# Patient Record
Sex: Female | Born: 1995 | Race: White | Hispanic: No | Marital: Single | Smoking: Never smoker
Health system: Southern US, Community
[De-identification: ages and names within clinical notes are randomized; demographics above are authoritative.]

## PROBLEM LIST (undated history)

## (undated) DIAGNOSIS — F909 Attention-deficit hyperactivity disorder, unspecified type: Secondary | ICD-10-CM

---
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
## 2017-07-06 ENCOUNTER — Other Ambulatory Visit: Payer: Self-pay

## 2017-07-06 ENCOUNTER — Emergency Department (HOSPITAL_COMMUNITY)
Admission: EM | Admit: 2017-07-06 | Discharge: 2017-07-07 | Disposition: A | Discharge status: Home / Self Care | Payer: BC Managed Care – PPO | Attending: Emergency Medicine | Admitting: Emergency Medicine

## 2017-07-06 ENCOUNTER — Emergency Department (HOSPITAL_COMMUNITY): Payer: BC Managed Care – PPO

## 2017-07-06 ENCOUNTER — Ambulatory Visit (HOSPITAL_COMMUNITY)
Admission: EM | Admit: 2017-07-06 | Discharge: 2017-07-06 | Disposition: A | Discharge status: Home / Self Care | Payer: BC Managed Care – PPO | Source: Home / Self Care

## 2017-07-06 ENCOUNTER — Encounter (HOSPITAL_COMMUNITY): Payer: Self-pay | Admitting: Emergency Medicine

## 2017-07-06 DIAGNOSIS — H1132 Conjunctival hemorrhage, left eye: Secondary | ICD-10-CM | POA: Diagnosis not present

## 2017-07-06 DIAGNOSIS — S0282XA Fracture of other specified skull and facial bones, left side, initial encounter for closed fracture: Secondary | ICD-10-CM | POA: Insufficient documentation

## 2017-07-06 DIAGNOSIS — W2103XA Struck by baseball, initial encounter: Secondary | ICD-10-CM | POA: Diagnosis not present

## 2017-07-06 DIAGNOSIS — Y999 Unspecified external cause status: Secondary | ICD-10-CM | POA: Diagnosis not present

## 2017-07-06 DIAGNOSIS — Y939 Activity, unspecified: Secondary | ICD-10-CM | POA: Diagnosis not present

## 2017-07-06 DIAGNOSIS — S0592XA Unspecified injury of left eye and orbit, initial encounter: Secondary | ICD-10-CM | POA: Diagnosis present

## 2017-07-06 DIAGNOSIS — Y929 Unspecified place or not applicable: Secondary | ICD-10-CM | POA: Insufficient documentation

## 2017-07-06 DIAGNOSIS — S0512XA Contusion of eyeball and orbital tissues, left eye, initial encounter: Secondary | ICD-10-CM | POA: Diagnosis not present

## 2017-07-06 DIAGNOSIS — Z79899 Other long term (current) drug therapy: Secondary | ICD-10-CM | POA: Insufficient documentation

## 2017-07-06 DIAGNOSIS — S0285XA Fracture of orbit, unspecified, initial encounter for closed fracture: Secondary | ICD-10-CM

## 2017-07-06 HISTORY — DX: Attention-deficit hyperactivity disorder, unspecified type: F90.9

## 2017-07-06 NOTE — ED Provider Notes (Signed)
[facility]    CSN: [id number] Arrival date & time: 07/06/17  1840     History   Chief Complaint Chief Complaint  Patient presents with  . Eye Injury    HPI Olivia Schmitt is a 22 y.o. female.   The history is provided by the patient. No language interpreter was used.  Eye Injury  This is a new problem. The current episode started more than 2 days ago. The problem occurs constantly. The problem has been gradually worsening. Nothing aggravates the symptoms. Nothing relieves the symptoms. She has tried nothing for the symptoms. The treatment provided no relief.  Pt was hit in the left eye 3 days ago.  Pt reports she was hit with a baseball.  Pt complains of pain, redness and double vision.    Past Medical History:  Diagnosis Date  . ADHD     There are no active problems to display for this patient.   History reviewed. No pertinent surgical history.  OB History   None      Home Medications    Prior to Admission medications   Not on File    Family History No family history on file.  Social History Social History   Tobacco Use  . Smoking status: Never Smoker  . Smokeless tobacco: Never Used  Substance Use Topics  . Alcohol use: Not on file  . Drug use: Not on file     Allergies   Patient has no known allergies.   Review of Systems Review of Systems  All other systems reviewed and are negative.    Physical Exam Triage Vital Signs ED Triage Vitals [07/06/17 1852]  Enc Vitals Group     BP 137/85     Pulse Rate 60     Resp 18     Temp 98.7 F (37.1 C)     Temp src      SpO2 100 %     Weight      Height      Head Circumference      Peak Flow      Pain Score      Pain Loc      Pain Edu?      Excl. in GC?    No data found.  Updated Vital Signs BP 137/85   Pulse 60   Temp 98.7 F (37.1 C)   Resp 18   SpO2 100%   Visual Acuity Right Eye Distance: 20/20 Left Eye Distance: 20/100 Bilateral Distance: 20/50  Right Eye  Near:   Left Eye Near:    Bilateral Near:     Physical Exam  Constitutional: She appears well-developed and well-nourished.  HENT:  Head: Normocephalic.  Right Ear: External ear normal.  Left Ear: External ear normal.  Eyes: Pupils are equal, round, and reactive to light.  Subconjunctival hemorrhage left,  No hyphema   Cardiovascular: Normal rate.  Pulmonary/Chest: Effort normal.  Neurological: She is alert.  Skin: Skin is warm.  Psychiatric: She has a normal mood and affect.  Nursing note and vitals reviewed.    UC Treatments / Results  Labs (all labs ordered are listed, but only abnormal results are displayed) Labs Reviewed - No data to display  EKG None  Radiology No results found.  Procedures Procedures (including critical care time)  Medications Ordered in UC Medications - No data to display  Initial Impression / Assessment and Plan / UC Course  I have reviewed the triage vital signs and the  nursing notes.  Pertinent labs & imaging results that were available during my care of the patient were reviewed by me and considered in my medical decision making (see chart for details).     Pt to Ed for possible orbital rim fracture  Ct needs ct scan Final Clinical Impressions(s) / UC Diagnoses   Final diagnoses:  Contusion of left orbital tissues, initial encounter     Discharge Instructions     Go to the Emergency department for ct scan   ED Prescriptions    None     Controlled Substance Prescriptions [place] Controlled Substance Registry consulted? Not Applicable   Elson AreasSofia, Tricha Ruggirello K, [place]PA-C 07/06/17 1941

## 2017-07-06 NOTE — Discharge Instructions (Addendum)
Go to the Emergency department for ct scan

## 2017-07-06 NOTE — ED Triage Notes (Signed)
Pt sent from urgent care for orbital CT d/t injury from baseball hitting her left eye 3 days ago. Swelling has subsided. Headache and double vision continue.  Bruising noted.

## 2017-07-06 NOTE — ED Provider Notes (Signed)
[facility] EMERGENCY DEPARTMENT Provider Note   CSN: [id number] Arrival date & time: 07/06/17  1944     History   Chief Complaint Chief Complaint  Patient presents with  . Eye Injury    HPI Olivia Schmitt is a 22 y.o. female.  HPI Patient presents to the emergency department with a left eye injury that occurred 3 days ago.  The patient states she was hit in the left eye with a baseball.  The patient states that the left eye swelling is decreased since that time but is still swollen.  She states that she does have some double vision and blurry vision.  The patient states that she does have some pain with movements of her eye.  Patient denies headache, weakness, dizziness, neck pain, visual loss or syncope Past Medical History:  Diagnosis Date  . ADHD     There are no active problems to display for this patient.   History reviewed. No pertinent surgical history.   OB History   None      Home Medications    Prior to Admission medications   Medication Sig Start Date End Date Taking? Authorizing Provider  methylphenidate (RITALIN LA) 10 MG 24 hr capsule Take 30 mg by mouth daily as needed (days of class).   Yes [provider]  Norgestimate-Ethinyl Estradiol Triphasic (TRI-PREVIFEM) 0.18/0.215/0.25 MG-35 MCG tablet Take 1 tablet by mouth daily.   Yes [provider]    Family History No family history on file.  Social History Social History   Tobacco Use  . Smoking status: Never Smoker  . Smokeless tobacco: Never Used  Substance Use Topics  . Alcohol use: Not on file  . Drug use: Not on file     Allergies   Patient has no known allergies.   Review of Systems Review of Systems All other systems negative except as documented in the HPI. All pertinent positives and negatives as reviewed in the HPI. Physical Exam Updated Vital Signs BP 119/74 (BP Location: Right Arm)   Pulse (!) 55   Temp 98.7 F (37.1 C) (Oral)   Resp  18   LMP 07/05/2017 (Exact Date)   SpO2 100%   Physical Exam  Constitutional: She is oriented to person, place, and time. She appears well-developed and well-nourished. No distress.  HENT:  Head: Normocephalic and atraumatic.  Eyes: Pupils are equal, round, and reactive to light. Left eye exhibits no discharge. Left conjunctiva has a hemorrhage.    Pulmonary/Chest: Effort normal.  Neurological: She is alert and oriented to person, place, and time.  Skin: Skin is warm and dry.  Psychiatric: She has a normal mood and affect.  Nursing note and vitals reviewed.    ED Treatments / Results  Labs (all labs ordered are listed, but only abnormal results are displayed) Labs Reviewed - No data to display  EKG None  Radiology Ct Orbits Wo Contrast  Result Date: 07/06/2017 CLINICAL DATA:  Struck in LEFT eye with a baseball on Wednesday, eye is swollen and bruised, double vision, headache EXAM: CT ORBITS WITHOUT CONTRAST TECHNIQUE: Multidetector CT images were obtained using the standard protocol without intravenous contrast. Sagittal and coronal MPR images reconstructed from axial data set. Right side of face marked with BB. COMPARISON:  None FINDINGS: Orbits: No intraorbital soft tissue swelling or abnormal fluid collection. Symmetric optic globes. Comminuted fracture of the inferior wall of the LEFT orbit with extension of fat and the inferior rectus muscle through the fracture into  the superior LEFT maxillary sinus. Remaining orbital walls appear intact. No additional facial bone fractures identified. TMJ alignment normal. Visualized sinuses: Small amount of fluid dependently in the LEFT maxillary sinus. Minimal mucosal thickening in a few LEFT ethmoid air cells. Remaining paranasal sinuses, mastoid air cells, and middle ear cavities clear. Soft tissues: Remaining facial soft tissues unremarkable. Limited intracranial: Unremarkable IMPRESSION: Displaced comminuted fracture of the inferior wall  LEFT orbit with entrapment of the LEFT inferior rectus muscle and extension of fat into the superior aspect of the LEFT maxillary sinus. Electronically Signed   By: Ulyses SouthwardMark  Boles M.D.   On: 07/06/2017 20:36    Procedures Procedures (including critical care time)  Medications Ordered in ED Medications - No data to display   Initial Impression / Assessment and Plan / ED Course  I have reviewed the triage vital signs and the nursing notes.  Pertinent labs & imaging results that were available during my care of the patient were reviewed by me and considered in my medical decision making (see chart for details).    I spoke with the maxillofacial trauma doctor tonight who is Dr. Annalee GentaShoemaker who reviewed the CT scan and felt that the patient needs to follow-up with him next Wednesday.  Also spoke with Dr. Sherryll BurgerShah the ophthalmologist who will see the patient first thing Monday morning.  There is no signs of trauma to the globe and there is no hyphema and the patient's pupils are round and reactive.  There is no other signs of injury and the red reflex is intact.  I advised the patient and her father of the plan and all questions were answered.  Patient agrees to the plan.  I did give the patient strict return precautions to the emergency department and patient voiced an understanding of these.  Final Clinical Impressions(s) / ED Diagnoses   Final diagnoses:  None    ED Discharge Orders    None       Charlestine NightLawyer, Gissele Narducci, PA-C 07/07/17 0005    Donnetta Hutchingook, Brian, MD 07/07/17 [phone]

## 2017-07-06 NOTE — ED Triage Notes (Signed)
Pt was hit in the face with baseball 3 days ago. L eye is swollen shut.

## 2017-07-07 MED ORDER — HYDROCODONE-ACETAMINOPHEN 5-325 MG PO TABS: 1.0000 | ORAL_TABLET | Freq: Four times a day (QID) | ORAL | 0 refills | Status: DC | PRN

## 2017-07-07 MED ORDER — HYDROCODONE-ACETAMINOPHEN 5-325 MG PO TABS
1.0000 | ORAL_TABLET | Freq: Once | ORAL | Status: AC
  Administered 2017-07-07: 1 via ORAL
  Filled 2017-07-07: qty 1

## 2017-07-07 NOTE — Discharge Instructions (Addendum)
Return here as needed.  Follow-up with the specialist provided.  Return for any worsening in your condition.

## 2018-01-07 ENCOUNTER — Emergency Department (HOSPITAL_COMMUNITY)
Admission: EM | Admit: 2018-01-07 | Discharge: 2018-01-08 | Disposition: A | Discharge status: Home / Self Care | Payer: BC Managed Care – PPO | Attending: Emergency Medicine | Admitting: Emergency Medicine

## 2018-01-07 ENCOUNTER — Encounter (HOSPITAL_COMMUNITY): Payer: Self-pay | Admitting: Emergency Medicine

## 2018-01-07 ENCOUNTER — Emergency Department (HOSPITAL_COMMUNITY): Payer: BC Managed Care – PPO

## 2018-01-07 DIAGNOSIS — Z79899 Other long term (current) drug therapy: Secondary | ICD-10-CM | POA: Diagnosis not present

## 2018-01-07 DIAGNOSIS — Y939 Activity, unspecified: Secondary | ICD-10-CM | POA: Diagnosis not present

## 2018-01-07 DIAGNOSIS — Y999 Unspecified external cause status: Secondary | ICD-10-CM | POA: Insufficient documentation

## 2018-01-07 DIAGNOSIS — S61252A Open bite of right middle finger without damage to nail, initial encounter: Secondary | ICD-10-CM | POA: Diagnosis present

## 2018-01-07 DIAGNOSIS — Y929 Unspecified place or not applicable: Secondary | ICD-10-CM | POA: Insufficient documentation

## 2018-01-07 DIAGNOSIS — W540XXA Bitten by dog, initial encounter: Secondary | ICD-10-CM | POA: Diagnosis not present

## 2018-01-07 DIAGNOSIS — F909 Attention-deficit hyperactivity disorder, unspecified type: Secondary | ICD-10-CM | POA: Diagnosis not present

## 2018-01-07 NOTE — ED Triage Notes (Signed)
Pt was trying to break up a dog fight, right hand was bitten.  Not sure which dog but reports all are vaccinated. Bleeding is controlled at this time.

## 2018-01-08 MED ORDER — AMOXICILLIN-POT CLAVULANATE 875-125 MG PO TABS: 1.0000 | ORAL_TABLET | Freq: Two times a day (BID) | ORAL | 0 refills | Status: DC

## 2018-01-08 MED ORDER — AMOXICILLIN-POT CLAVULANATE 875-125 MG PO TABS
1.0000 | ORAL_TABLET | Freq: Once | ORAL | Status: AC
  Administered 2018-01-08: 1 via ORAL
  Filled 2018-01-08: qty 1

## 2018-01-08 MED ORDER — BACITRACIN ZINC 500 UNIT/GM EX OINT
1.0000 "application " | TOPICAL_OINTMENT | Freq: Two times a day (BID) | CUTANEOUS | Status: DC
  Administered 2018-01-08: 1 via TOPICAL

## 2018-01-08 MED ORDER — HYDROCODONE-ACETAMINOPHEN 5-325 MG PO TABS: 1.0000 | ORAL_TABLET | Freq: Four times a day (QID) | ORAL | 0 refills | Status: DC | PRN

## 2018-01-08 NOTE — ED Notes (Signed)
Patient verbalizes understanding of discharge instructions. Opportunity for questioning and answers were provided. Armband removed by staff, pt discharged from ED ambulatory.   

## 2018-01-08 NOTE — ED Provider Notes (Signed)
[facility]   CSN: [id number] Arrival date & time: 01/07/18  2121     History   Chief Complaint Chief Complaint  Patient presents with  . Animal Bite    HPI Ewing SchleinSophia Schmitt is a 23 y.o. female.  Patient presents to the emergency department with a chief complaint of finger injury.  She states that she was breaking up a fight between 2 pet dogs, when she was bitten on her right middle finger.  She complains of mild pain.  Bleeding is controlled with gauze.  Denies any other injuries.  Last tetanus shot was 2 years ago.  The dogs are fully immunized.  The history is provided by the patient. No language interpreter was used.    Past Medical History:  Diagnosis Date  . ADHD     There are no active problems to display for this patient.   History reviewed. No pertinent surgical history.   OB History   No obstetric history on file.      Home Medications    Prior to Admission medications   Medication Sig Start Date End Date Taking? Authorizing Provider  HYDROcodone-acetaminophen (NORCO/VICODIN) 5-325 MG tablet Take 1 tablet by mouth every 6 (six) hours as needed for moderate pain. 07/07/17   Lawyer, Cristal Deerhristopher, PA-C  methylphenidate (RITALIN LA) 10 MG 24 hr capsule Take 30 mg by mouth daily as needed (days of class).    [provider]  Norgestimate-Ethinyl Estradiol Triphasic (TRI-PREVIFEM) 0.18/0.215/0.25 MG-35 MCG tablet Take 1 tablet by mouth daily.    [provider]    Family History No family history on file.  Social History Social History   Tobacco Use  . Smoking status: Never Smoker  . Smokeless tobacco: Never Used  Substance Use Topics  . Alcohol use: Not on file  . Drug use: Not on file     Allergies   Patient has no known allergies.   Review of Systems Review of Systems  All other systems reviewed and are negative.    Physical Exam Updated Vital Signs BP 131/80 (BP  Location: Right Arm)   Pulse 63   Temp 98.6 F (37 C) (Oral)   Resp 19   Ht 5\' 4"  (1.626 m)   Wt 59 kg   LMP 12/24/2017   SpO2 100%   BMI 22.32 kg/m   Physical Exam Vitals signs and nursing note reviewed.  Constitutional:      General: She is not in acute distress.    Appearance: She is not diaphoretic.  HENT:     Head: Normocephalic and atraumatic.  Eyes:     Conjunctiva/sclera: Conjunctivae normal.     Pupils: Pupils are equal, round, and reactive to light.  Neck:     Trachea: No tracheal deviation.  Cardiovascular:     Rate and Rhythm: Normal rate.  Pulmonary:     Effort: Pulmonary effort is normal. No respiratory distress.  Abdominal:     Palpations: Abdomen is soft.  Musculoskeletal: Normal range of motion.  Skin:    General: Skin is warm and dry.     Comments: 1 cm laceration on the ulnar aspect of the 3rd distal phalanx without apparent nailbed involvement or FB  Neurological:     Mental Status: She is alert and oriented to person, place, and time.  Psychiatric:        Judgment: Judgment normal.      ED Treatments / Results  Labs (all labs ordered are  listed, but only abnormal results are displayed) Labs Reviewed - No data to display  EKG None  Radiology Dg Hand Complete Right  Result Date: 01/07/2018 CLINICAL DATA:  Dog bite EXAM: RIGHT HAND - COMPLETE 3+ VIEW COMPARISON:  None. FINDINGS: Acute mildly displaced tuft fracture third distal phalanx. No subluxation. No radiopaque foreign body IMPRESSION: Acute mildly displaced tuft fracture of the third distal phalanx Electronically Signed   By: Jasmine PangKim  Fujinaga M.D.   On: 01/07/2018 23:33    Procedures Procedures (including critical care time)  Medications Ordered in ED Medications  bacitracin ointment 1 application (has no administration in time range)  amoxicillin-clavulanate (AUGMENTIN) 875-125 MG per tablet 1 tablet (has no administration in time range)     Initial Impression / Assessment and  Plan / ED Course  I have reviewed the triage vital signs and the nursing notes.  Pertinent labs & imaging results that were available during my care of the patient were reviewed by me and considered in my medical decision making (see chart for details).     Patient with dog bite.  There is a minor laceration.  We will not close this with suture due to risk of infection.  The wound is been cleaned.  Will splint finger.  Patient discharged with a few pain pills and Augmentin.  Recommend PCP follow-up.  Return precautions discussed.  Final Clinical Impressions(s) / ED Diagnoses   Final diagnoses:  Dog bite, initial encounter    ED Discharge Orders         Ordered    amoxicillin-clavulanate (AUGMENTIN) 875-125 MG tablet  Every 12 hours     01/08/18 0438    HYDROcodone-acetaminophen (NORCO/VICODIN) 5-325 MG tablet  Every 6 hours PRN     01/08/18 0438           Roxy HorsemanBrowning, Mckinzi Eriksen, PA-C 01/08/18 0439    Ward, Layla MawKristen N, DO 01/08/18 [phone]

## 2018-12-31 ENCOUNTER — Other Ambulatory Visit: Payer: Self-pay

## 2018-12-31 ENCOUNTER — Encounter (HOSPITAL_COMMUNITY): Payer: Self-pay | Admitting: Emergency Medicine

## 2018-12-31 ENCOUNTER — Ambulatory Visit (INDEPENDENT_AMBULATORY_CARE_PROVIDER_SITE_OTHER): Payer: BC Managed Care – PPO

## 2018-12-31 ENCOUNTER — Ambulatory Visit (HOSPITAL_COMMUNITY)
Admission: EM | Admit: 2018-12-31 | Discharge: 2018-12-31 | Disposition: A | Discharge status: Home / Self Care | Payer: BC Managed Care – PPO | Attending: Family Medicine | Admitting: Family Medicine

## 2018-12-31 DIAGNOSIS — S99921A Unspecified injury of right foot, initial encounter: Secondary | ICD-10-CM | POA: Diagnosis not present

## 2018-12-31 DIAGNOSIS — M25571 Pain in right ankle and joints of right foot: Secondary | ICD-10-CM | POA: Diagnosis not present

## 2018-12-31 DIAGNOSIS — S93401A Sprain of unspecified ligament of right ankle, initial encounter: Secondary | ICD-10-CM | POA: Diagnosis not present

## 2018-12-31 NOTE — ED Triage Notes (Signed)
Pt fell off of a horse and injured right foot. She believes it may have gotten caught in te stirrup as she fell. Happened this afternoon.

## 2019-01-03 NOTE — ED Provider Notes (Signed)
[place]   [id number] 12/31/18 Arrival Time: 4742  ASSESSMENT & PLAN:  1. Sprain of right ankle, unspecified ligament, initial encounter     I have personally viewed the imaging studies ordered this visit. No ankle fracture appreciated.  Will treat as a sprain. AVS for instructions.  Orders Placed This Encounter  Procedures  . DG Ankle Complete Right  . Apply ASO ankle    Recommend: Follow-up Information    [facility].   Why: If worsening or failing to improve as anticipated. Contact information: [address] [place] [phone]           Reviewed expectations re: course of current medical issues. Questions answered. Outlined signs and symptoms indicating need for more acute intervention. Patient verbalized understanding. After Visit Summary given.  SUBJECTIVE: History from: patient. Olivia Schmitt is a 24 y.o. female who reports fairly persistent mild to moderate pain of her right ankle; described as aching; without radiation. Onset: abrupt. First noted: today. Injury/trama: yes, reports getting foot caught in saddle stirrup and twisting ankle; has been able to bear weight. Symptoms have progressed to a point and plateaued since beginning. Aggravating factors: weight bearing. Alleviating factors: rest. Associated symptoms: none reported. Extremity sensation changes or weakness: none. Self treatment: has not tried OTC therapies.  History of similar: no.  History reviewed. No pertinent surgical history.   ROS: As per HPI. All other systems negative.    OBJECTIVE:  Vitals:   12/31/18 1636  BP: 133/79  Pulse: (!) 59  Resp: 16  Temp: 98.2 F (36.8 C)  TempSrc: Oral  SpO2: 100%    General appearance: alert; no distress HEENT: [place]; AT Neck: supple with FROM Resp: unlabored respirations Extremities: . RLE: warm with well perfused appearance; poorly localized moderate  tenderness over right medial and lateral ankle; without gross deformities; swelling: moderate; bruising: none; ankle ROM: normal, with discomfort CV: brisk extremity capillary refill of RLE; 2+ DP pulse of RLE. Skin: warm and dry; no visible rashes Neurologic: gait normal; normal reflexes of RLE; normal sensation of RLE; normal strength of RLE Psychological: alert and cooperative; normal mood and affect  Imaging: DG Ankle Complete Right  Result Date: 12/31/2018 CLINICAL DATA:  Fall off horse today. Bilateral ankle pain. Initial encounter. EXAM: RIGHT ANKLE - COMPLETE 3+ VIEW COMPARISON:  None. FINDINGS: There is no evidence of fracture, dislocation, or joint effusion. There is no evidence of arthropathy or other focal bone abnormality. Soft tissue swelling is seen overlying the lateral malleolus. IMPRESSION: Lateral soft tissue swelling. No evidence of fracture. Electronically Signed   By: Marlaine Hind M.D.   On: 12/31/2018 17:06      No Known Allergies  Past Medical History:  Diagnosis Date  . ADHD    Social History   Socioeconomic History  . Marital status: Single    Spouse name: Not on file  . Number of children: Not on file  . Years of education: Not on file  . Highest education level: Not on file  Occupational History  . Not on file  Tobacco Use  . Smoking status: Never Smoker  . Smokeless tobacco: Never Used  Substance and Sexual Activity  . Alcohol use: Not on file  . Drug use: Not on file  . Sexual activity: Not on file  Other Topics Concern  . Not on file  Social History Narrative  . Not on file   Social Determinants of Health  Financial Resource Strain:   . Difficulty of Paying Living Expenses: Not on file  Food Insecurity:   . Worried About Programme researcher, broadcasting/film/video in the Last Year: Not on file  . Ran Out of Food in the Last Year: Not on file  Transportation Needs:   . Lack of Transportation (Medical): Not on file  . Lack of Transportation (Non-Medical): Not  on file  Physical Activity:   . Days of Exercise per Week: Not on file  . Minutes of Exercise per Session: Not on file  Stress:   . Feeling of Stress : Not on file  Social Connections:   . Frequency of Communication with Friends and Family: Not on file  . Frequency of Social Gatherings with Friends and Family: Not on file  . Attends Religious Services: Not on file  . Active Member of Clubs or Organizations: Not on file  . Attends Banker Meetings: Not on file  . Marital Status: Not on file   No family history on file. History reviewed. No pertinent surgical history.    Mardella Layman, MD 01/03/19 [phone]

## 2020-07-28 ENCOUNTER — Ambulatory Visit (INDEPENDENT_AMBULATORY_CARE_PROVIDER_SITE_OTHER): Payer: BC Managed Care – PPO

## 2020-07-28 ENCOUNTER — Ambulatory Visit (HOSPITAL_COMMUNITY)
Admission: EM | Admit: 2020-07-28 | Discharge: 2020-07-28 | Disposition: A | Discharge status: Home / Self Care | Payer: BC Managed Care – PPO

## 2020-07-28 ENCOUNTER — Encounter (HOSPITAL_COMMUNITY): Payer: Self-pay

## 2020-07-28 ENCOUNTER — Other Ambulatory Visit: Payer: Self-pay

## 2020-07-28 DIAGNOSIS — R0781 Pleurodynia: Secondary | ICD-10-CM

## 2020-07-28 DIAGNOSIS — M549 Dorsalgia, unspecified: Secondary | ICD-10-CM

## 2020-07-28 DIAGNOSIS — S29012A Strain of muscle and tendon of back wall of thorax, initial encounter: Secondary | ICD-10-CM

## 2020-07-28 DIAGNOSIS — M546 Pain in thoracic spine: Secondary | ICD-10-CM | POA: Diagnosis not present

## 2020-07-28 MED ORDER — PREDNISONE 10 MG (21) PO TBPK: ORAL_TABLET | Freq: Every day | ORAL | 0 refills | Status: DC

## 2020-07-28 NOTE — ED Provider Notes (Signed)
[facility]    CSN: [id number] Arrival date & time: 07/28/20  1036      History   Chief Complaint Chief Complaint  Patient presents with   Back Pain    HPI Olivia Schmitt is a 25 y.o. female.   Patient presents to urgent care with 1 week history of left-sided back pain.  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  Does have history of stress fractures to spine.  Denies any numbness or tingling.  Patient does state that pain is described as a sharp intermittent back pain that sometimes radiates around to her left rib cage.   Back Pain  Past Medical History:  Diagnosis Date   ADHD     There are no problems to display for this patient.   History reviewed. No pertinent surgical history.  OB History   No obstetric history on file.      Home Medications    Prior to Admission medications   Medication Sig Start Date End Date Taking? Authorizing Provider  citalopram (CELEXA) 20 MG tablet Take by mouth. 07/27/20 07/27/21 Yes [provider]  predniSONE (STERAPRED UNI-PAK 21 TAB) 10 MG (21) TBPK tablet Take by mouth daily. Take 6 tabs by mouth daily  for 2 days, then 5 tabs for 2 days, then 4 tabs for 2 days, then 3 tabs for 2 days, 2 tabs for 2 days, then 1 tab by mouth daily for 2 days 07/28/20  Yes Lance Muss, FNP  amoxicillin-clavulanate (AUGMENTIN) 875-125 MG tablet Take 1 tablet by mouth every 12 (twelve) hours. 01/08/18   Roxy Horseman, PA-C  HYDROcodone-acetaminophen (NORCO/VICODIN) 5-325 MG tablet Take 1-2 tablets by mouth every 6 (six) hours as needed. 01/08/18   Roxy Horseman, PA-C  lisdexamfetamine (VYVANSE) 30 MG capsule Take 1 capsule by mouth every morning. 08/26/20 09/24/20  [provider]  methylphenidate (RITALIN LA) 10 MG 24 hr capsule Take 30 mg by mouth daily as needed (days of class).    [provider]  Norgestimate-Ethinyl Estradiol  Triphasic 0.18/0.215/0.25 MG-35 MCG tablet Take 1 tablet by mouth daily.    [provider]    Family History History reviewed. No pertinent family history.  Social History Social History   Tobacco Use   Smoking status: Never   Smokeless tobacco: Never  Substance Use Topics   Alcohol use: Never   Drug use: Never     Allergies   Patient has no known allergies.   Review of Systems Review of Systems Per HPI  Physical Exam Triage Vital Signs ED Triage Vitals  Enc Vitals Group     BP 07/28/20 1136 116/70     Pulse Rate 07/28/20 1136 (!) 58     Resp 07/28/20 1136 16     Temp 07/28/20 1136 98.5 F (36.9 C)     Temp Source 07/28/20 1136 Oral     SpO2 07/28/20 1136 100 %     Weight --      Height --      Head Circumference --      Peak Flow --      Pain Score 07/28/20 1133 9     Pain Loc --      Pain Edu? --      Excl. in GC? --    No data found.  Updated Vital Signs BP 116/70 (BP Location: Right Arm)   Pulse (!) 58  Temp 98.5 F (36.9 C) (Oral)   Resp 16   LMP  (Within Weeks) Comment: 2 weeks  SpO2 100%   Visual Acuity Right Eye Distance:   Left Eye Distance:   Bilateral Distance:    Right Eye Near:   Left Eye Near:    Bilateral Near:     Physical Exam Constitutional:      Appearance: Normal appearance.  HENT:     Head: Normocephalic and atraumatic.  Eyes:     Extraocular Movements: Extraocular movements intact.     Conjunctiva/sclera: Conjunctivae normal.  Pulmonary:     Effort: Pulmonary effort is normal.  Musculoskeletal:     Cervical back: Normal.     Thoracic back: Tenderness and bony tenderness present. Normal range of motion.     Lumbar back: Normal.     Comments: Tenderness to palpation to musculature directly to left of thoracic spine at approximately T6-T10.  Tenderness to palpation also directly over spine at approximately T7-T9.  Neurological:     General: No focal deficit present.     Mental Status: She is alert and  oriented to person, place, and time. Mental status is at baseline.  Psychiatric:        Mood and Affect: Mood normal.        Behavior: Behavior normal.        Thought Content: Thought content normal.        Judgment: Judgment normal.     UC Treatments / Results  Labs (all labs ordered are listed, but only abnormal results are displayed) Labs Reviewed - No data to display  EKG   Radiology DG Ribs Unilateral Left  Result Date: 07/28/2020 CLINICAL DATA:  Left-sided back pain EXAM: LEFT RIBS - 2 VIEW COMPARISON:  None. FINDINGS: There is an age-indeterminate deformity involving the posterolateral aspect of the left third rib. No acute displaced rib fractures identified. IMPRESSION: Age-indeterminate deformity involving the posterolateral aspect of the left third rib. Electronically Signed   By: Signa Kell M.D.   On: 07/28/2020 12:45   DG Thoracic Spine 2 View  Result Date: 07/28/2020 CLINICAL DATA:  Left side mid back pain for 1-1/2 weeks. No known injury. EXAM: THORACIC SPINE 2 VIEWS COMPARISON:  None. FINDINGS: There is no evidence of thoracic spine fracture. No listhesis. Minimal convex right curvature noted. No other significant bone abnormalities are identified. IMPRESSION: Minimal convex right curvature.  Otherwise normal. Electronically Signed   By: Drusilla Kanner M.D.   On: 07/28/2020 12:44    Procedures Procedures (including critical care time)  Medications Ordered in UC Medications - No data to display  Initial Impression / Assessment and Plan / UC Course  I have reviewed the triage vital signs and the nursing notes.  Pertinent labs & imaging results that were available during my care of the patient were reviewed by me and considered in my medical decision making (see chart for details).     X-rays were negative for any acute bony abnormality.  Left rib x-ray did show age-indeterminate bony deformity of left rib.  PCP assistance requested for patient and patient is  to follow-up with PCP for further imaging and evaluation of this bony deformity.  Clinical signs and symptoms are most consistent with thoracic muscular back strain.  Will treat with prednisone taper.  Patient was advised not take any over-the-counter NSAIDs while taking prednisone.  May take Tylenol.  Patient to alternate ice and heat application and to follow-up with orthopedic sports medicine if pain  does not resolve in the next 1 to 2 weeks.Discussed strict return precautions. Patient verbalized understanding and is agreeable with plan.  Final Clinical Impressions(s) / UC Diagnoses   Final diagnoses:  Rib pain on left side  Strain of thoracic back region  Acute left-sided thoracic back pain     Discharge Instructions      You are being treated for muscular strain of back with prednisone steroid taper.  Please take as prescribed.  Please follow-up with orthopedic sports medicine provided contact information if pain does not resolve in the next 1 to 2 weeks.  Primary care assistance has been requested for you.  Someone from [facility] health will reach out to you to set up an appointment.  Please follow-up with primary care to have possible repeat imaging of left ribs for further evaluation and management.     ED Prescriptions     Medication Sig Dispense Auth. Provider   predniSONE (STERAPRED UNI-PAK 21 TAB) 10 MG (21) TBPK tablet Take by mouth daily. Take 6 tabs by mouth daily  for 2 days, then 5 tabs for 2 days, then 4 tabs for 2 days, then 3 tabs for 2 days, 2 tabs for 2 days, then 1 tab by mouth daily for 2 days 42 tablet Lance Muss, FNP      PDMP not reviewed this encounter.   Lance Muss, FNP 07/28/20 1406

## 2020-07-28 NOTE — ED Triage Notes (Signed)
Pt reports left sided middle back pain x 1 1/2 week. 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

## 2020-07-28 NOTE — Discharge Instructions (Signed)
You are being treated for muscular strain of back with prednisone steroid taper.  Please take as prescribed.  Please follow-up with orthopedic sports medicine provided contact information if pain does not resolve in the next 1 to 2 weeks.  Primary care assistance has been requested for you.  Someone from [facility] health will reach out to you to set up an appointment.  Please follow-up with primary care to have possible repeat imaging of left ribs for further evaluation and management.

## 2021-08-31 IMAGING — DX DG RIBS 2V*L*
2 series · 2 of 2 positions shown · non-contrast
Comparison: None.

CLINICAL DATA: Left-sided back pain

EXAM:
LEFT RIBS - 2 VIEW

[rib obl]
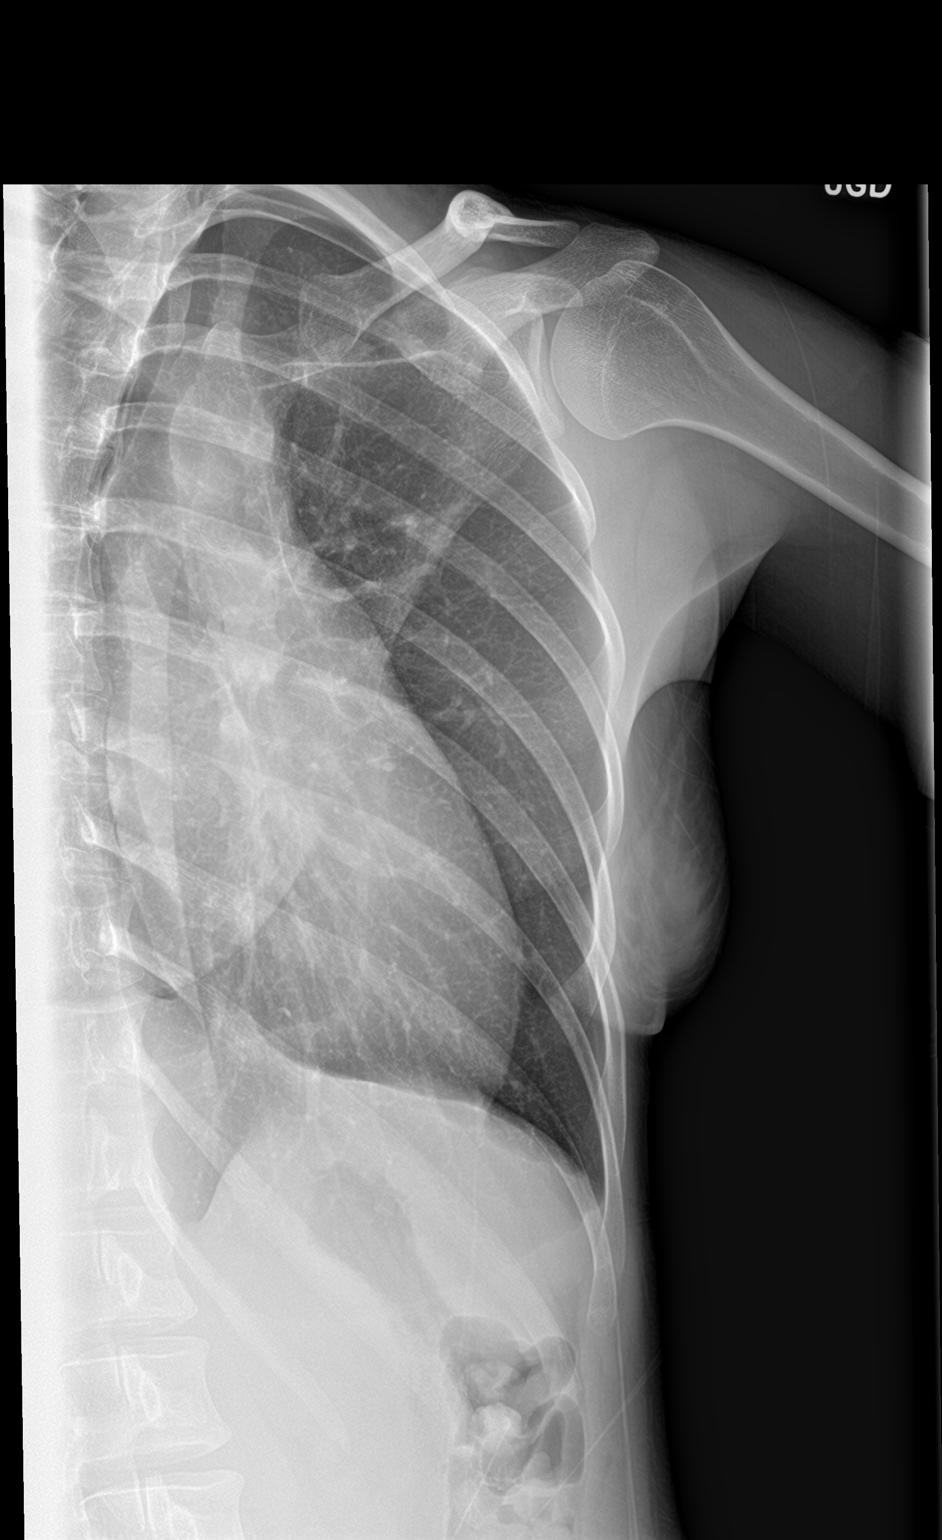

[rib ap]
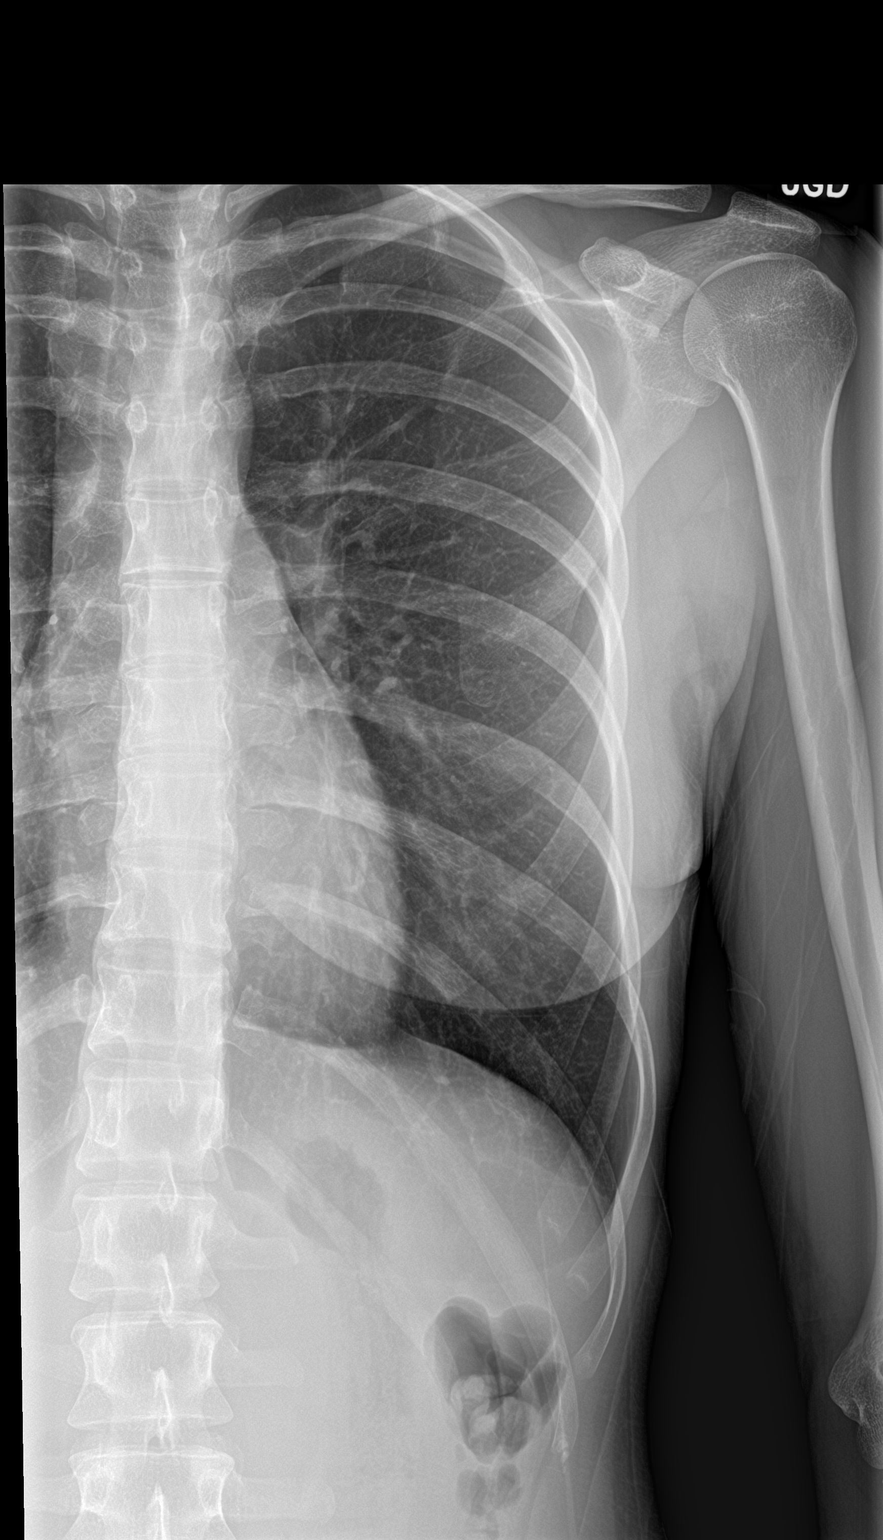

[2 of 2 positions shown; findings below may reference images not displayed]

FINDINGS: There is an age-indeterminate deformity involving the posterolateral
aspect of the left third rib. No acute displaced rib fractures
identified.
IMPRESSION: Age-indeterminate deformity involving the posterolateral aspect of
the left third rib.

## 2021-08-31 IMAGING — DX DG THORACIC SPINE 2V
2 series · 2 of 2 positions shown · non-contrast
Comparison: None.

CLINICAL DATA: Left side mid back pain for 1-1/2 weeks. No known
injury.

EXAM:
THORACIC SPINE 2 VIEWS

[t-spine ap]
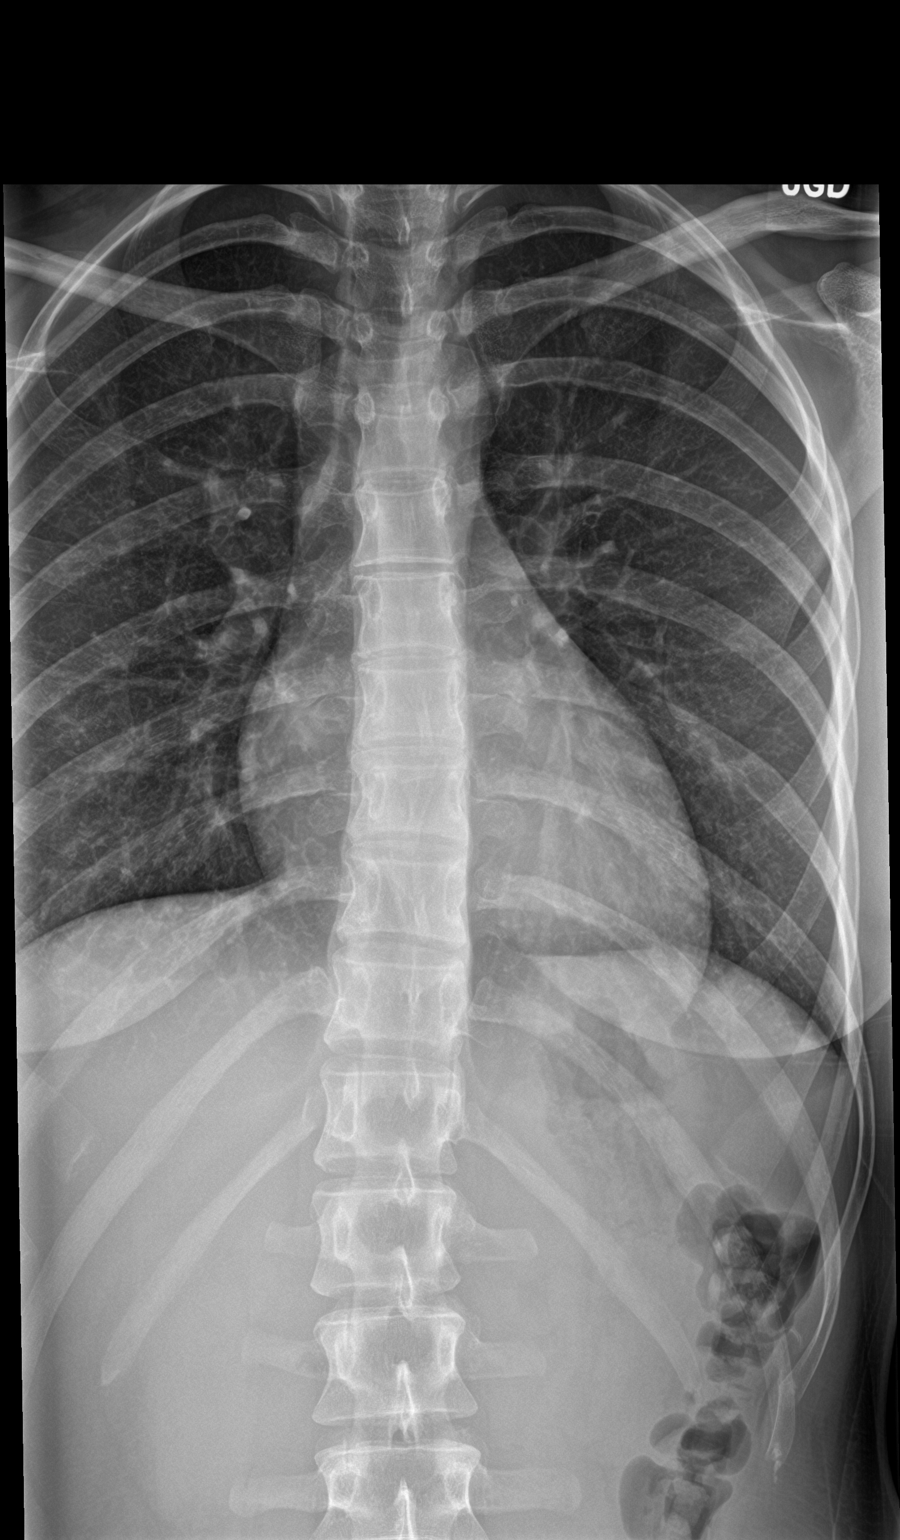

[t-spine lat]
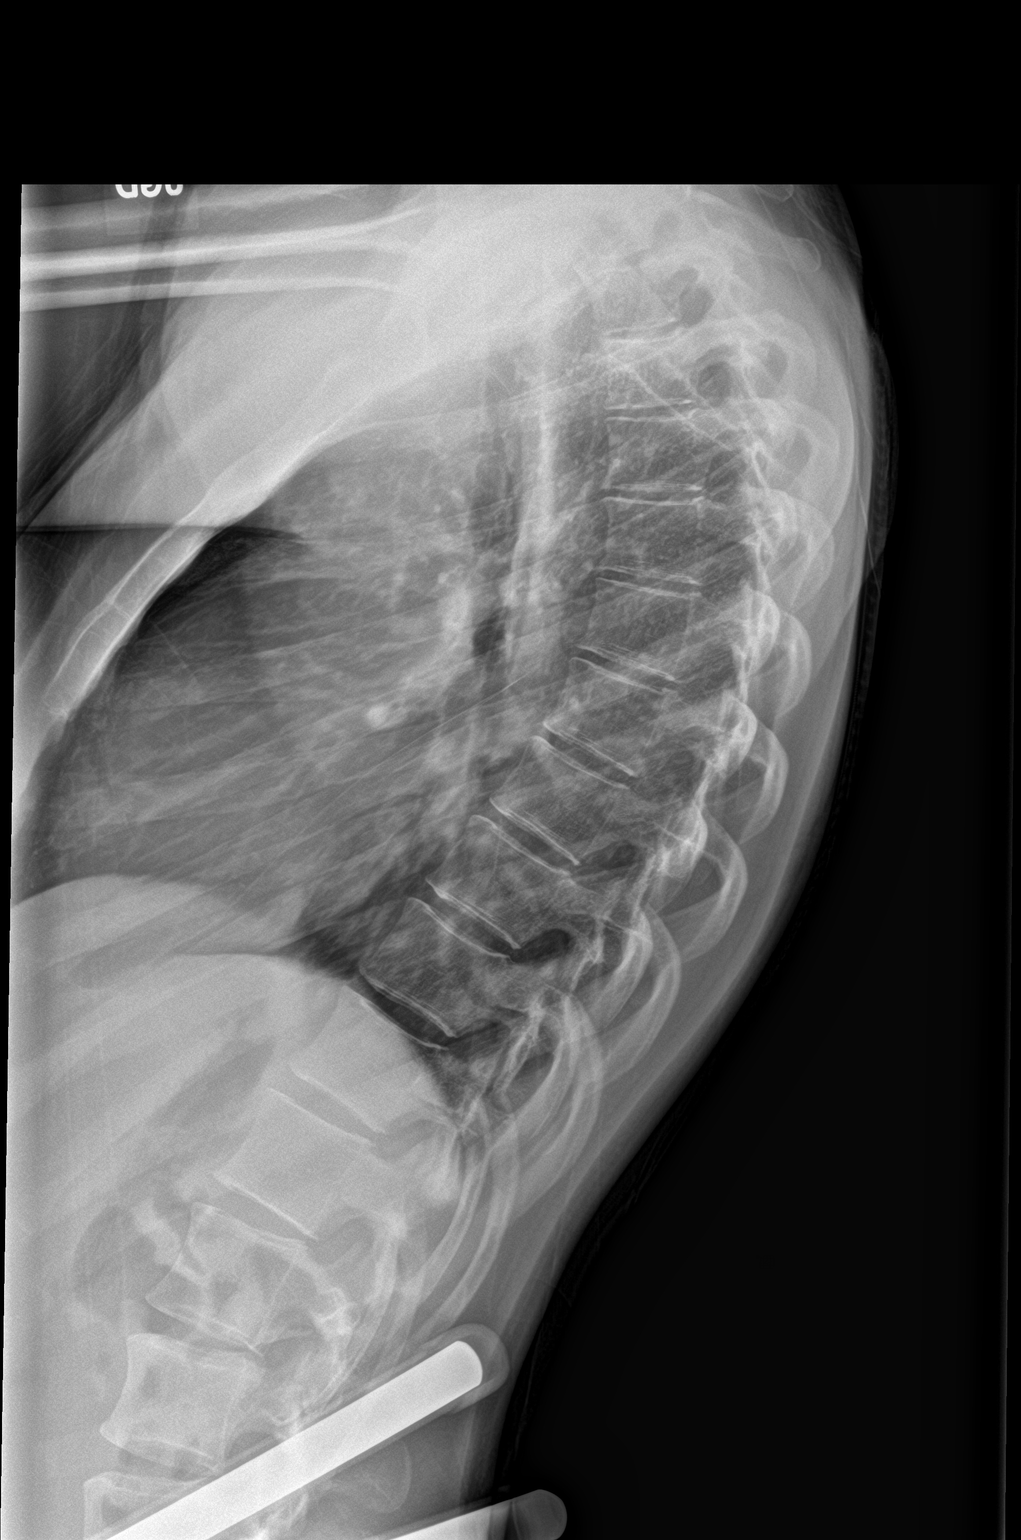

[2 of 2 positions shown; findings below may reference images not displayed]

FINDINGS: There is no evidence of thoracic spine fracture. No listhesis.
Minimal convex right curvature noted. No other significant bone
abnormalities are identified.
IMPRESSION: Minimal convex right curvature.  Otherwise normal.

## 2023-06-11 ENCOUNTER — Encounter (HOSPITAL_BASED_OUTPATIENT_CLINIC_OR_DEPARTMENT_OTHER): Payer: Self-pay

## 2023-06-11 ENCOUNTER — Other Ambulatory Visit (HOSPITAL_BASED_OUTPATIENT_CLINIC_OR_DEPARTMENT_OTHER): Payer: Self-pay

## 2023-06-11 ENCOUNTER — Ambulatory Visit (HOSPITAL_BASED_OUTPATIENT_CLINIC_OR_DEPARTMENT_OTHER)
Admission: EM | Admit: 2023-06-11 | Discharge: 2023-06-11 | Disposition: A | Discharge status: Home / Self Care | Attending: Family Medicine | Admitting: Family Medicine

## 2023-06-11 DIAGNOSIS — N926 Irregular menstruation, unspecified: Secondary | ICD-10-CM

## 2023-06-11 DIAGNOSIS — N912 Amenorrhea, unspecified: Secondary | ICD-10-CM

## 2023-06-11 LAB — POCT URINE PREGNANCY: Preg Test, Ur: NEGATIVE

## 2023-06-11 MED ORDER — PRENATAL COMPLETE 14-0.4 MG PO TABS
1.0000 | ORAL_TABLET | Freq: Every day | ORAL | 0 refills | Status: AC
  Filled 2023-06-11: qty 90, fill #0

## 2023-06-11 NOTE — ED Provider Notes (Signed)
 [facility]    CSN: [id number] Arrival date & time: 06/11/23  1136      History   Chief Complaint Chief Complaint  Patient presents with   Amenorrhea    HPI Olivia Schmitt is a 28 y.o. female.   Patient is not currently on birth control.  Last menstrual period was 05/07/2023.  She is 10 days late for her cycle based on her normal menstrual schedule.  On 06/09/2023 and again on 06/10/2023, she had a positive home pregnancy test.  This morning she started with light menstrual flow, repeated the home pregnancy test and it was negative today.  She is not currently seeing an OB/GYN group.     Past Medical History:  Diagnosis Date   ADHD     There are no active problems to display for this patient.   History reviewed. No pertinent surgical history.  OB History   No obstetric history on file.      Home Medications    Prior to Admission medications   Medication Sig Start Date End Date Taking? Authorizing Provider  buPROPion (WELLBUTRIN XL) 150 MG 24 hr tablet Take 150 mg by mouth daily. 03/29/23  Yes [provider]  Prenatal Vit-Fe Fumarate-FA (PRENATAL COMPLETE) 14-0.4 MG TABS Take 1 each by mouth daily. 06/11/23  Yes Guss Legacy, FNP  citalopram (CELEXA) 20 MG tablet Take by mouth. 07/27/20 07/27/21  [provider]  lisdexamfetamine (VYVANSE) 30 MG capsule Take 1 capsule by mouth every morning. 08/26/20 09/24/20  [provider]    Family History History reviewed. No pertinent family history.  Social History Social History   Tobacco Use   Smoking status: Never   Smokeless tobacco: Never  Substance Use Topics   Alcohol use: Never   Drug use: Never     Allergies   Patient has no known allergies.   Review of Systems Review of Systems  Constitutional:  Negative for fever.  Respiratory:  Negative for cough.   Cardiovascular:  Negative for chest pain.  Gastrointestinal:  Negative for abdominal pain, constipation, diarrhea,  nausea and vomiting.  Genitourinary:  Positive for menstrual problem (Amenorrhea and irregular menstrual cycles.).  Musculoskeletal:  Negative for arthralgias and back pain.  Skin:  Negative for color change and rash.  Neurological:  Negative for syncope.  All other systems reviewed and are negative.    Physical Exam Triage Vital Signs ED Triage Vitals [06/11/23 1206]  Encounter Vitals Group     BP 130/84     Systolic BP Percentile      Diastolic BP Percentile      Pulse Rate (!) 52     Resp 18     Temp 98.2 F (36.8 C)     Temp Source Oral     SpO2 100 %     Weight      Height      Head Circumference      Peak Flow      Pain Score      Pain Loc      Pain Education      Exclude from Growth Chart    No data found.  Updated Vital Signs BP 130/84 (BP Location: Right Arm)   Pulse (!) 52   Temp 98.2 F (36.8 C) (Oral)   Resp 18   LMP 05/07/2023   SpO2 100%   Visual Acuity Right Eye Distance:   Left Eye Distance:   Bilateral Distance:    Right Eye Near:  Left Eye Near:    Bilateral Near:     Physical Exam Vitals and nursing note reviewed.  Constitutional:      General: She is not in acute distress.    Appearance: She is well-developed. She is not ill-appearing or toxic-appearing.  HENT:     Head: Normocephalic and atraumatic.     Right Ear: Hearing, tympanic membrane, ear canal and external ear normal.     Left Ear: Hearing, tympanic membrane, ear canal and external ear normal.     Nose: No congestion or rhinorrhea.     Right Sinus: No maxillary sinus tenderness or frontal sinus tenderness.     Left Sinus: No maxillary sinus tenderness or frontal sinus tenderness.     Mouth/Throat:     Lips: Pink.     Mouth: Mucous membranes are moist.     Pharynx: Uvula midline. No oropharyngeal exudate or posterior oropharyngeal erythema.     Tonsils: No tonsillar exudate.  Eyes:     Conjunctiva/sclera: Conjunctivae normal.     Pupils: Pupils are equal, round, and  reactive to light.  Cardiovascular:     Rate and Rhythm: Normal rate and regular rhythm.     Heart sounds: S1 normal and S2 normal. No murmur heard. Pulmonary:     Effort: Pulmonary effort is normal. No respiratory distress.     Breath sounds: Normal breath sounds. No decreased breath sounds, wheezing, rhonchi or rales.  Abdominal:     General: Bowel sounds are normal.     Palpations: Abdomen is soft.     Tenderness: There is no abdominal tenderness.  Musculoskeletal:        General: No swelling.     Cervical back: Neck supple.  Lymphadenopathy:     Head:     Right side of head: No submental, submandibular, tonsillar, preauricular or posterior auricular adenopathy.     Left side of head: No submental, submandibular, tonsillar, preauricular or posterior auricular adenopathy.     Cervical: No cervical adenopathy.     Right cervical: No superficial cervical adenopathy.    Left cervical: No superficial cervical adenopathy.  Skin:    General: Skin is warm and dry.     Capillary Refill: Capillary refill takes less than 2 seconds.     Findings: No rash.  Neurological:     Mental Status: She is alert and oriented to person, place, and time.  Psychiatric:        Mood and Affect: Mood normal.      UC Treatments / Results  Labs (all labs ordered are listed, but only abnormal results are displayed) Labs Reviewed  POCT URINE PREGNANCY - Normal    EKG   Radiology No results found.  Procedures Procedures (including critical care time)  Medications Ordered in UC Medications - No data to display  Initial Impression / Assessment and Plan / UC Course  I have reviewed the triage vital signs and the nursing notes.  Pertinent labs & imaging results that were available during my care of the patient were reviewed by me and considered in my medical decision making (see chart for details).  Plan of Care: Amenorrhea and irregular menstrual bleeding: Advised that given her recent  positive test then started menstrual flow and negative test, she could actually have had a pregnancy and having an early miscarriage.  Although this is not anything we can verify at this point.  She is not seeking pregnancy but she is not on birth control.  Encouraged to  consider taking a prenatal vitamin daily for general health and preconception care.  Advised that if her menstrual cycle is a light flow and she continues to have concerns about possible pregnancy, she could return for serum hCG testing.  Also if there is any question about pregnancy, she should avoid alcohol and take the prenatal vitamins until we can verify no pregnancy.  Follow-up here as needed.  I reviewed the plan of care with the patient and/or the patient's guardian.  The patient and/or guardian had time to ask questions and acknowledged that the questions were answered.  I provided instruction on symptoms or reasons to return here or to go to an ER, if symptoms/condition did not improve, worsened or if new symptoms occurred.  Final Clinical Impressions(s) / UC Diagnoses   Final diagnoses:  Amenorrhea  Irregular menstrual bleeding     Discharge Instructions      Amenorrhea and irregular menstrual bleeding: Urine pregnancy test is negative.  Patient is not diagnosed with pregnancy right now.  Discussed use of prenatal vitamins and preconception care.  Provided prenatal vitamin, 1 pill daily.  Advised that if this menstrual cycle is light or she misses another menstrual cycle she should return for testing for pregnancy.  Advised that if she is not sure about her cycles she should behave as if she is pregnant, take the prenatal vitamins, avoid alcohol and follow-up as needed.   ED Prescriptions     Medication Sig Dispense Auth. Provider   Prenatal Vit-Fe Fumarate-FA (PRENATAL COMPLETE) 14-0.4 MG TABS Take 1 each by mouth daily. 90 tablet Guss Legacy, FNP      PDMP not reviewed this encounter.   Guss Legacy,  FNP 06/11/23 1252

## 2023-06-11 NOTE — ED Triage Notes (Signed)
 Pt reports she was 10 days late for her period. LMP 05/07/23. On Sunday and Monday had positive at home UPTs. Today she has had bleeding, which may be her period, and a negative UPT.

## 2023-06-11 NOTE — Discharge Instructions (Signed)
 Amenorrhea and irregular menstrual bleeding: Urine pregnancy test is negative.  Patient is not diagnosed with pregnancy right now.  Discussed use of prenatal vitamins and preconception care.  Provided prenatal vitamin, 1 pill daily.  Advised that if this menstrual cycle is light or she misses another menstrual cycle she should return for testing for pregnancy.  Advised that if she is not sure about her cycles she should behave as if she is pregnant, take the prenatal vitamins, avoid alcohol and follow-up as needed.
